# Patient Record
Sex: Female | Born: 1960 | Race: White | Hispanic: No | Marital: Married | Smoking: Never smoker
Health system: Southern US, Community
[De-identification: ages and names within clinical notes are randomized; demographics above are authoritative.]

## PROBLEM LIST (undated history)

## (undated) DIAGNOSIS — N814 Uterovaginal prolapse, unspecified: Secondary | ICD-10-CM

## (undated) DIAGNOSIS — M1990 Unspecified osteoarthritis, unspecified site: Secondary | ICD-10-CM

## (undated) DIAGNOSIS — F419 Anxiety disorder, unspecified: Secondary | ICD-10-CM

## (undated) DIAGNOSIS — Z86010 Personal history of colonic polyps: Secondary | ICD-10-CM

## (undated) DIAGNOSIS — Z973 Presence of spectacles and contact lenses: Secondary | ICD-10-CM

## (undated) HISTORY — PX: NO PAST SURGERIES: SHX2092

## (undated) HISTORY — DX: Anxiety disorder, unspecified: F41.9

## (undated) HISTORY — PX: BLADDER SUSPENSION: SHX72

## (undated) HISTORY — PX: COLONOSCOPY: SHX174

---

## 2002-11-21 ENCOUNTER — Encounter: Payer: Self-pay | Admitting: Obstetrics and Gynecology

## 2002-11-21 ENCOUNTER — Ambulatory Visit (HOSPITAL_COMMUNITY): Admission: RE | Admit: 2002-11-21 | Discharge: 2002-11-21 | Payer: Self-pay | Admitting: Obstetrics and Gynecology

## 2002-12-21 ENCOUNTER — Encounter: Payer: Self-pay | Admitting: Obstetrics and Gynecology

## 2002-12-21 ENCOUNTER — Ambulatory Visit (HOSPITAL_COMMUNITY): Admission: RE | Admit: 2002-12-21 | Discharge: 2002-12-21 | Payer: Self-pay | Admitting: Obstetrics and Gynecology

## 2003-02-11 ENCOUNTER — Ambulatory Visit (HOSPITAL_COMMUNITY): Admission: RE | Admit: 2003-02-11 | Discharge: 2003-02-11 | Payer: Self-pay | Admitting: Obstetrics and Gynecology

## 2003-04-30 ENCOUNTER — Inpatient Hospital Stay (HOSPITAL_COMMUNITY): Admission: AD | Admit: 2003-04-30 | Discharge: 2003-05-02 | Payer: Self-pay | Admitting: Obstetrics and Gynecology

## 2003-06-11 ENCOUNTER — Other Ambulatory Visit: Admission: RE | Admit: 2003-06-11 | Discharge: 2003-06-11 | Payer: Self-pay | Admitting: Obstetrics and Gynecology

## 2004-05-07 ENCOUNTER — Ambulatory Visit (HOSPITAL_COMMUNITY): Admission: RE | Admit: 2004-05-07 | Discharge: 2004-05-07 | Payer: Self-pay | Admitting: Obstetrics and Gynecology

## 2004-08-28 ENCOUNTER — Other Ambulatory Visit: Admission: RE | Admit: 2004-08-28 | Discharge: 2004-08-28 | Payer: Self-pay | Admitting: Obstetrics and Gynecology

## 2005-05-14 ENCOUNTER — Ambulatory Visit (HOSPITAL_COMMUNITY): Admission: RE | Admit: 2005-05-14 | Discharge: 2005-05-14 | Payer: Self-pay | Admitting: Obstetrics and Gynecology

## 2005-12-09 ENCOUNTER — Other Ambulatory Visit: Admission: RE | Admit: 2005-12-09 | Discharge: 2005-12-09 | Payer: Self-pay | Admitting: Obstetrics and Gynecology

## 2006-05-16 ENCOUNTER — Ambulatory Visit (HOSPITAL_COMMUNITY): Admission: RE | Admit: 2006-05-16 | Discharge: 2006-05-16 | Payer: Self-pay | Admitting: Obstetrics and Gynecology

## 2007-05-12 IMAGING — MG MM DIGITAL SCREENING BILAT
5 series · 5 of 5 positions shown · non-contrast
Comparison: none

DG SCREEN MAMMOGRAM BILATERAL
Bilateral CC and MLO view(s) were taken.

DIGITAL SCREENING MAMMOGRAM WITH CAD:
There is a fibroglandular pattern.  No masses or malignant type calcifications are identified.  
Compared with prior studies.

[R CC]
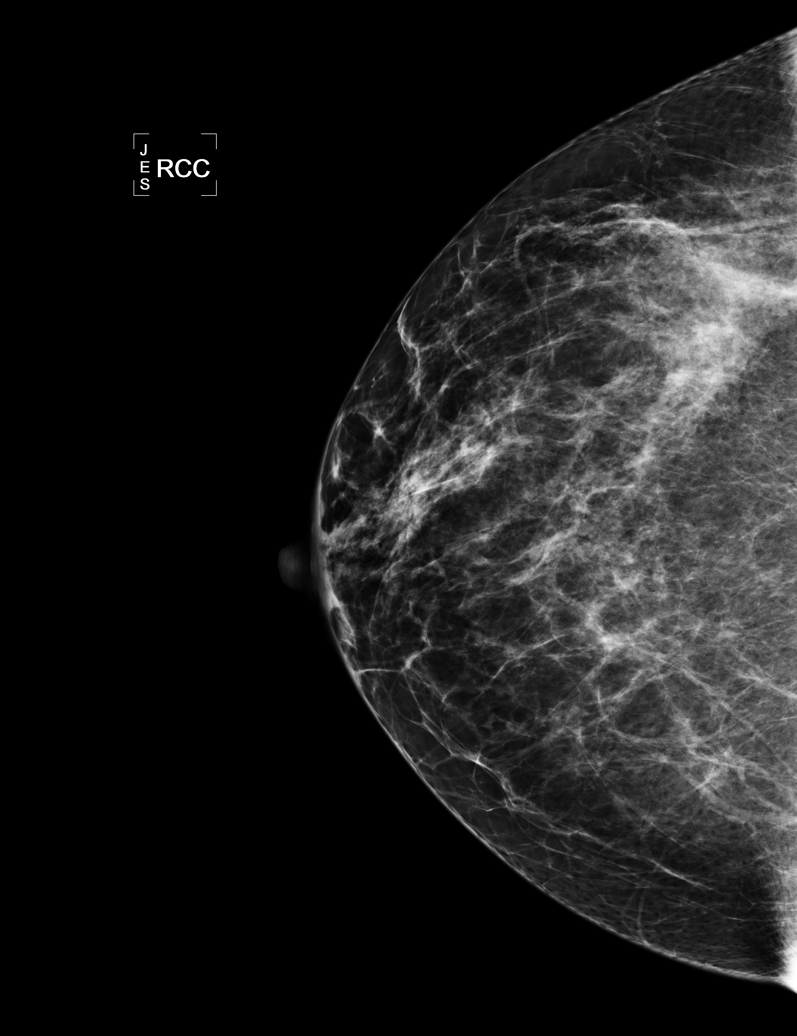

[R MLO (1 of 2)]
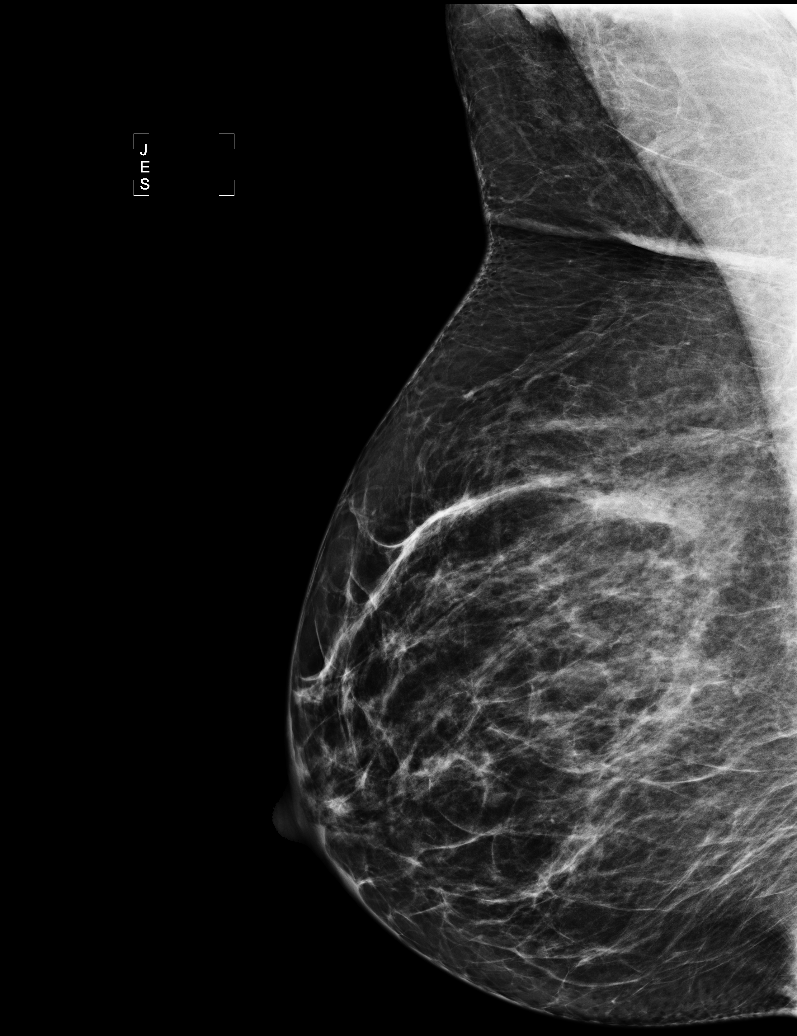

[L CC]
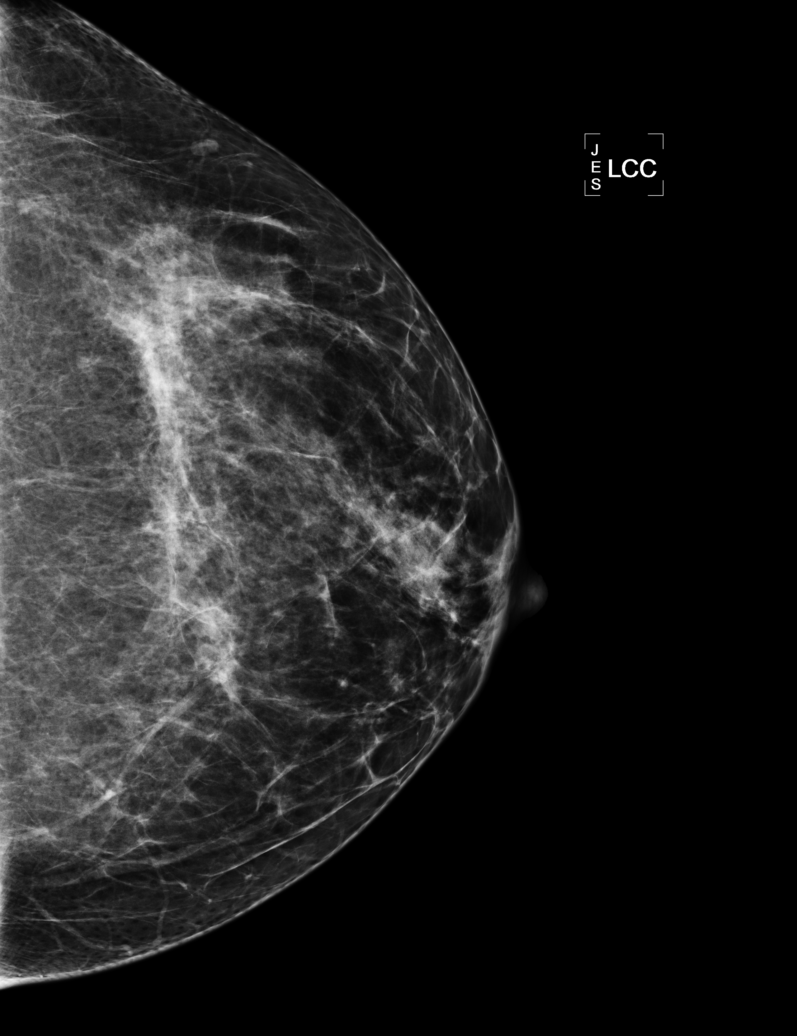

[L MLO]
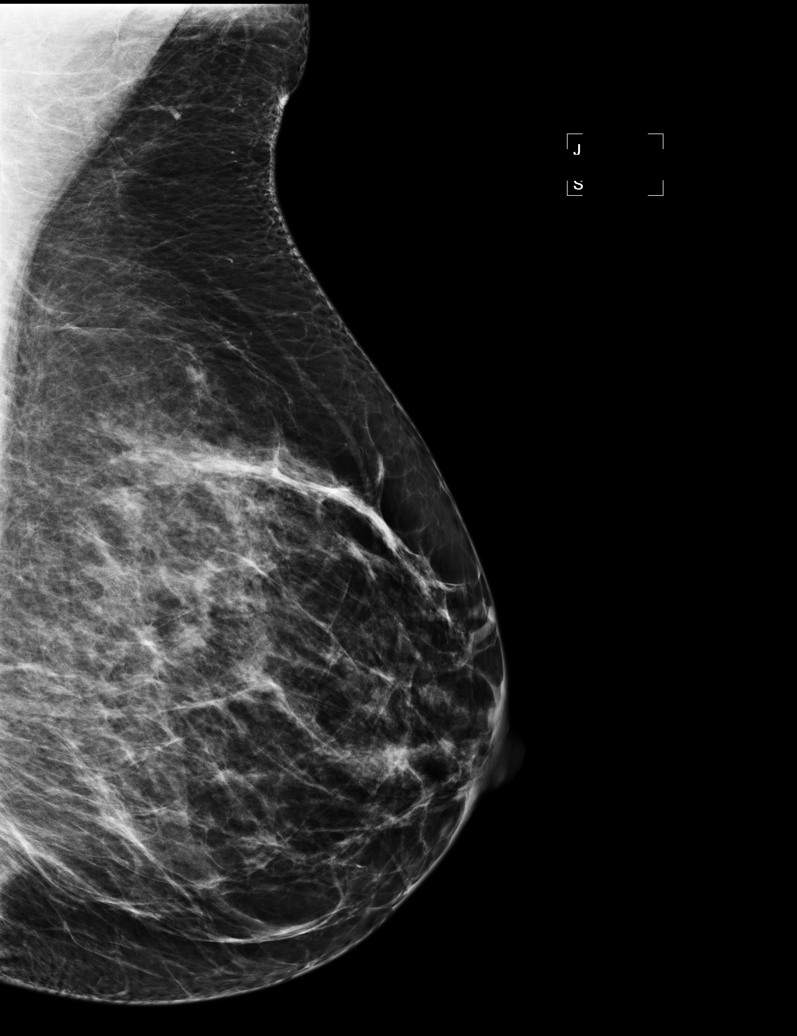

[R MLO (2 of 2)]
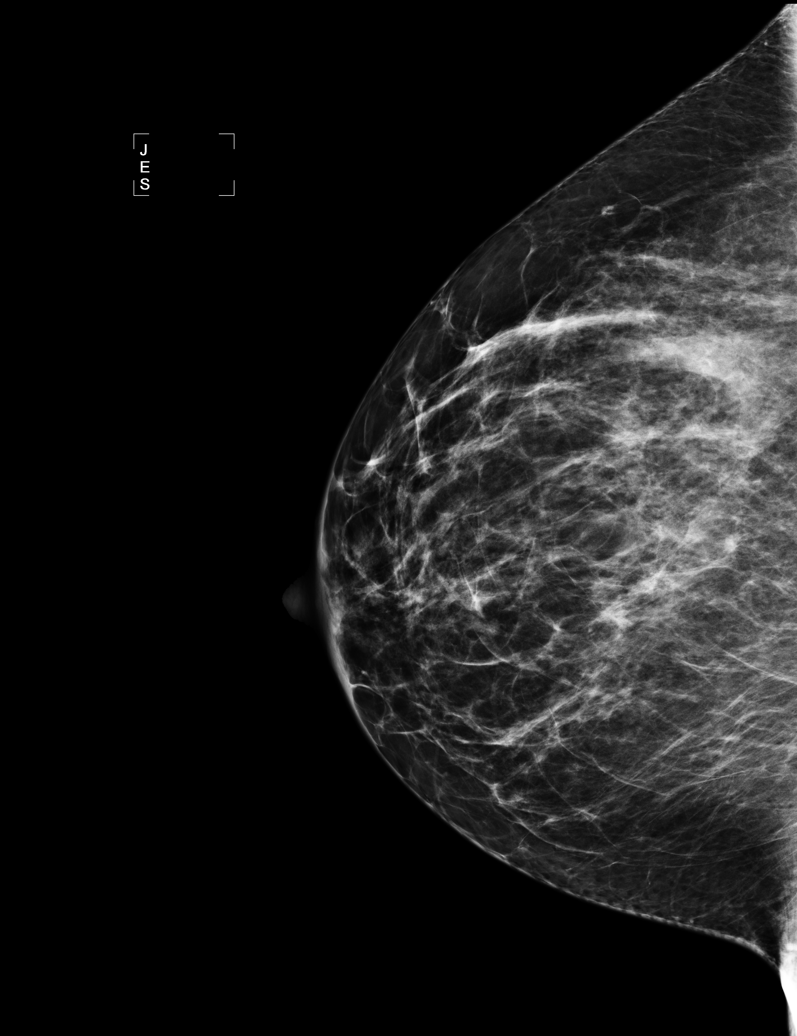

[5 of 5 positions shown; findings below may reference images not displayed]

IMPRESSION: No specific mammographic evidence of malignancy.  Next screening mammogram is recommended in one 
year.

ASSESSMENT: Negative - BI-RADS 1

Screening mammogram in 1 year.
ANALYZED BY COMPUTER AIDED DETECTION. , THIS PROCEDURE WAS A DIGITAL MAMMOGRAM.

## 2012-07-19 DIAGNOSIS — Z86010 Personal history of colonic polyps: Secondary | ICD-10-CM

## 2012-07-19 HISTORY — DX: Personal history of colonic polyps: Z86.010

## 2013-12-26 ENCOUNTER — Telehealth: Payer: Self-pay | Admitting: Genetic Counselor

## 2013-12-26 NOTE — Telephone Encounter (Signed)
LEFT MESSAGE FOR PATIENT TO RETURN CALL TO SCHEDULE GENETIC APPT.  °

## 2014-01-01 ENCOUNTER — Telehealth: Payer: Self-pay | Admitting: Genetic Counselor

## 2014-01-01 NOTE — Telephone Encounter (Signed)
LEFT MESSAGE FOR PATIENT TO RETURN CALL TO SCHEDULE GENETIC APPT.  °

## 2017-08-25 NOTE — Patient Instructions (Addendum)
Alben SpittleMary E Age  55/01/2016      Your procedure is scheduled on 08/29/2017   [facility]  At   0530  A.M.  Call this number if you have problems the morning of surgery:[phone]             OUR ADDRESS IS [address] , [facility] THE              [facility]    Remember:  Do not eat food or drink liquids after midnight.  Take these medicines the morning of surgery with A SIP OF WATER   Prozac  Do not wear jewelry, make-up or nail polish.  Do not wear lotions, powders, or perfumes, or deoderant.  Do not shave 48 hours prior to surgery.  Men may shave face and neck.  Do not bring valuables to the hospital.  [facility] is not responsible for any belongings or valuables.  Contacts, dentures or bridgework may not be worn into surgery.  Leave your suitcase in the car.  After surgery it may be brought to your room.  For patients admitted to the hospital, discharge time will be determined by your treatment team.  Otherwise [facility]   Special instructions:   Please read over the following fact sheets that you were given.    [facility] - Preparing for Surgery Before surgery, you can play an important role.  Because skin is not sterile, your skin needs to be as free of germs as possible.  You can reduce the number of germs on your skin by washing with CHG (chlorahexidine gluconate) soap before surgery.  CHG is an antiseptic cleaner which kills germs and bonds with the skin to continue killing germs even after washing. Please DO NOT use if you have an allergy to CHG or antibacterial soaps.  If your skin becomes reddened/irritated stop using the CHG and inform your nurse when you arrive at Short Stay. Do not shave (including legs and underarms) for at least 48 hours prior to the first CHG shower.  You may shave your face/neck. Please follow these instructions carefully:  1.   Shower with CHG Soap the night before surgery and the  morning of Surgery.  2.  If you choose to wash your hair, wash your hair first as usual with your  normal  shampoo.  3.  After you shampoo, rinse your hair and body thoroughly to remove the  shampoo.                           4.  Use CHG as you would any other liquid soap.  You can apply chg directly  to the skin and wash                       Gently with a scrungie or clean washcloth.  5.  Apply the CHG Soap to your body ONLY FROM THE NECK DOWN.   Do not use on face/ open                           Wound or open sores. Avoid contact with eyes, ears mouth and genitals (private parts).  Wash face,  Genitals (private parts) with your normal soap.             6.  Wash thoroughly, paying special attention to the area where your surgery  will be performed.  7.  Thoroughly rinse your body with warm water from the neck down.  8.  DO NOT shower/wash with your normal soap after using and rinsing off  the CHG Soap.                9.  Pat yourself dry with a clean towel.            10.  Wear clean pajamas.            11.  Place clean sheets on your bed the night of your first shower and do not  sleep with pets. Day of Surgery : Do not apply any lotions/deodorants the morning of surgery.  Please wear clean clothes to the hospital/surgery center.  FAILURE TO FOLLOW THESE INSTRUCTIONS MAY RESULT IN THE CANCELLATION OF YOUR SURGERY PATIENT SIGNATURE_________________________________  NURSE SIGNATURE__________________________________  ________________________________________________________________________  WHAT IS A BLOOD TRANSFUSION? Blood Transfusion Information  A transfusion is the replacement of blood or some of its parts. Blood is made up of multiple cells which provide different functions.  Red blood cells carry oxygen and are used for blood loss replacement.  White blood cells fight against infection.  Platelets control  bleeding.  Plasma helps clot blood.  Other blood products are available for specialized needs, such as hemophilia or other clotting disorders. BEFORE THE TRANSFUSION  Who gives blood for transfusions?   Healthy volunteers who are fully evaluated to make sure their blood is safe. This is blood bank blood. Transfusion therapy is the safest it has ever been in the practice of medicine. Before blood is taken from a donor, a complete history is taken to make sure that person has no history of diseases nor engages in risky social behavior (examples are intravenous drug use or sexual activity with multiple partners). The donor's travel history is screened to minimize risk of transmitting infections, such as malaria. The donated blood is tested for signs of infectious diseases, such as HIV and hepatitis. The blood is then tested to be sure it is compatible with you in order to minimize the chance of a transfusion reaction. If you or a relative donates blood, this is often done in anticipation of surgery and is not appropriate for emergency situations. It takes many days to process the donated blood. RISKS AND COMPLICATIONS Although transfusion therapy is very safe and saves many lives, the main dangers of transfusion include:   Getting an infectious disease.  Developing a transfusion reaction. This is an allergic reaction to something in the blood you were given. Every precaution is taken to prevent this. The decision to have a blood transfusion has been considered carefully by your caregiver before blood is given. Blood is not given unless the benefits outweigh the risks. AFTER THE TRANSFUSION  Right after receiving a blood transfusion, you will usually feel much better and more energetic. This is especially true if your red blood cells have gotten low (anemic). The transfusion raises the level of the red blood cells which carry oxygen, and this usually causes an energy increase.  The nurse  administering the transfusion will monitor you carefully for complications. HOME CARE INSTRUCTIONS  No special instructions are needed after a transfusion. You may find your energy is better. Speak with your caregiver about any  limitations on activity for underlying diseases you may have. SEEK MEDICAL CARE IF:   Your condition is not improving after your transfusion.  You develop redness or irritation at the intravenous (IV) site. SEEK IMMEDIATE MEDICAL CARE IF:  Any of the following symptoms occur over the next 12 hours:  Shaking chills.  You have a temperature by mouth above 102 F (38.9 C), not controlled by medicine.  Chest, back, or muscle pain.  People around you feel you are not acting correctly or are confused.  Shortness of breath or difficulty breathing.  Dizziness and fainting.  You get a rash or develop hives.  You have a decrease in urine output.  Your urine turns a dark color or changes to pink, red, or Smith. Any of the following symptoms occur over the next 10 days:  You have a temperature by mouth above 102 F (38.9 C), not controlled by medicine.  Shortness of breath.  Weakness after normal activity.  The white part of the eye turns yellow (jaundice).  You have a decrease in the amount of urine or are urinating less often.  Your urine turns a dark color or changes to pink, red, or [place]. Document Released: 07/02/2000 Document Revised: 09/27/2011 Document Reviewed: 02/19/2008 [facility] Patient Information 2014 [place], [place].  _______________________________________________________________________

## 2017-08-26 ENCOUNTER — Other Ambulatory Visit: Payer: Self-pay

## 2017-08-26 ENCOUNTER — Encounter (HOSPITAL_COMMUNITY)
Admission: RE | Admit: 2017-08-26 | Discharge: 2017-08-26 | Disposition: A | Discharge status: Home / Self Care | Payer: 59 | Source: Ambulatory Visit | Attending: Obstetrics and Gynecology | Admitting: Obstetrics and Gynecology

## 2017-08-26 ENCOUNTER — Encounter (HOSPITAL_COMMUNITY): Payer: Self-pay | Admitting: *Deleted

## 2017-08-26 DIAGNOSIS — N841 Polyp of cervix uteri: Secondary | ICD-10-CM | POA: Diagnosis not present

## 2017-08-26 DIAGNOSIS — Z79899 Other long term (current) drug therapy: Secondary | ICD-10-CM | POA: Diagnosis not present

## 2017-08-26 DIAGNOSIS — Z86010 Personal history of colonic polyps: Secondary | ICD-10-CM | POA: Diagnosis not present

## 2017-08-26 DIAGNOSIS — N838 Other noninflammatory disorders of ovary, fallopian tube and broad ligament: Secondary | ICD-10-CM | POA: Diagnosis not present

## 2017-08-26 DIAGNOSIS — N814 Uterovaginal prolapse, unspecified: Secondary | ICD-10-CM | POA: Diagnosis not present

## 2017-08-26 DIAGNOSIS — N840 Polyp of corpus uteri: Secondary | ICD-10-CM | POA: Diagnosis not present

## 2017-08-26 DIAGNOSIS — N72 Inflammatory disease of cervix uteri: Secondary | ICD-10-CM | POA: Diagnosis not present

## 2017-08-26 DIAGNOSIS — N8189 Other female genital prolapse: Secondary | ICD-10-CM | POA: Diagnosis present

## 2017-08-26 HISTORY — DX: Personal history of colonic polyps: Z86.010

## 2017-08-26 HISTORY — DX: Presence of spectacles and contact lenses: Z97.3

## 2017-08-26 HISTORY — DX: Unspecified osteoarthritis, unspecified site: M19.90

## 2017-08-26 HISTORY — DX: Uterovaginal prolapse, unspecified: N81.4

## 2017-08-26 LAB — CBC
HCT: 38.3 % (ref 36.0–46.0)
Hemoglobin: 13 g/dL (ref 12.0–15.0)
MCH: 30 pg (ref 26.0–34.0)
MCHC: 33.9 g/dL (ref 30.0–36.0)
MCV: 88.2 fL (ref 78.0–100.0)
Platelets: 212 10*3/uL (ref 150–400)
RBC: 4.34 MIL/uL (ref 3.87–5.11)
RDW: 13.1 % (ref 11.5–15.5)
WBC: 6.1 10*3/uL (ref 4.0–10.5)

## 2017-08-26 LAB — ABO/RH: ABO/RH(D): A NEG

## 2017-08-26 NOTE — Progress Notes (Signed)
Completed PAT appointment face to face.  Npo after mn w/ exception sips of water with prozac.  Arrive at 0530.  CBC and T&S done today.  Reviewed RCC guidelines.  Will do hibiclens shower hs before and am dos.

## 2017-08-28 NOTE — Anesthesia Preprocedure Evaluation (Addendum)
Anesthesia Evaluation  Patient identified by MRN, date of birth, ID band Patient awake    Reviewed: Allergy & Precautions, NPO status , Patient's Chart, lab work & pertinent test results  History of Anesthesia Complications Negative for: history of anesthetic complications  Airway Mallampati: I  TM Distance: >3 FB Neck ROM: Full    Dental  (+) Dental Advisory Given   Pulmonary neg pulmonary ROS,    Pulmonary exam normal        Cardiovascular negative cardio ROS   Rhythm:Regular Rate:Normal     Neuro/Psych negative neurological ROS     GI/Hepatic negative GI ROS, Neg liver ROS,   Endo/Other  negative endocrine ROS  Renal/GU negative Renal ROS     Musculoskeletal   Abdominal   Peds  Hematology negative hematology ROS (+)   Anesthesia Other Findings   Reproductive/Obstetrics                             Anesthesia Physical Anesthesia Plan  ASA: I  Anesthesia Plan: General   Post-op Pain Management:    Induction: Intravenous  PONV Risk Score and Plan: 4 or greater and Scopolamine patch - Pre-op, Dexamethasone and Ondansetron  Airway Management Planned: Oral ETT  Additional Equipment:   Intra-op Plan:   Post-operative Plan: Extubation in OR  Informed Consent: I have reviewed the patients History and Physical, chart, labs and discussed the procedure including the risks, benefits and alternatives for the proposed anesthesia with the patient or authorized representative who has indicated his/her understanding and acceptance.   Dental advisory given  Plan Discussed with: CRNA and Surgeon  Anesthesia Plan Comments: (Plan routine monitors, GETA)        Anesthesia Quick Evaluation

## 2017-08-29 ENCOUNTER — Encounter (HOSPITAL_BASED_OUTPATIENT_CLINIC_OR_DEPARTMENT_OTHER)
Admission: RE | Disposition: A | Discharge status: Home / Self Care | Payer: Self-pay | Source: Ambulatory Visit | Attending: Obstetrics and Gynecology

## 2017-08-29 ENCOUNTER — Ambulatory Visit (HOSPITAL_BASED_OUTPATIENT_CLINIC_OR_DEPARTMENT_OTHER): Payer: 59 | Admitting: Anesthesiology

## 2017-08-29 ENCOUNTER — Observation Stay (HOSPITAL_BASED_OUTPATIENT_CLINIC_OR_DEPARTMENT_OTHER)
Admission: RE | Admit: 2017-08-29 | Discharge: 2017-08-30 | Disposition: A | Discharge status: Home / Self Care | Payer: 59 | Source: Ambulatory Visit | Attending: Obstetrics and Gynecology | Admitting: Obstetrics and Gynecology

## 2017-08-29 ENCOUNTER — Other Ambulatory Visit: Payer: Self-pay

## 2017-08-29 ENCOUNTER — Encounter (HOSPITAL_BASED_OUTPATIENT_CLINIC_OR_DEPARTMENT_OTHER): Payer: Self-pay | Admitting: *Deleted

## 2017-08-29 DIAGNOSIS — N840 Polyp of corpus uteri: Secondary | ICD-10-CM | POA: Insufficient documentation

## 2017-08-29 DIAGNOSIS — N814 Uterovaginal prolapse, unspecified: Secondary | ICD-10-CM | POA: Insufficient documentation

## 2017-08-29 DIAGNOSIS — Z86010 Personal history of colonic polyps: Secondary | ICD-10-CM | POA: Insufficient documentation

## 2017-08-29 DIAGNOSIS — Z79899 Other long term (current) drug therapy: Secondary | ICD-10-CM | POA: Insufficient documentation

## 2017-08-29 DIAGNOSIS — N8189 Other female genital prolapse: Secondary | ICD-10-CM | POA: Diagnosis not present

## 2017-08-29 DIAGNOSIS — N838 Other noninflammatory disorders of ovary, fallopian tube and broad ligament: Secondary | ICD-10-CM | POA: Insufficient documentation

## 2017-08-29 DIAGNOSIS — N72 Inflammatory disease of cervix uteri: Secondary | ICD-10-CM | POA: Insufficient documentation

## 2017-08-29 DIAGNOSIS — N819 Female genital prolapse, unspecified: Secondary | ICD-10-CM | POA: Diagnosis present

## 2017-08-29 DIAGNOSIS — N841 Polyp of cervix uteri: Secondary | ICD-10-CM | POA: Insufficient documentation

## 2017-08-29 HISTORY — PX: LAPAROSCOPIC VAGINAL HYSTERECTOMY WITH SALPINGECTOMY: SHX6680

## 2017-08-29 LAB — TYPE AND SCREEN
ABO/RH(D): A NEG
Antibody Screen: NEGATIVE

## 2017-08-29 SURGERY — HYSTERECTOMY, VAGINAL, LAPAROSCOPY-ASSISTED, WITH SALPINGECTOMY
Anesthesia: General | Site: Vagina | Laterality: Bilateral

## 2017-08-29 MED ORDER — HYDROMORPHONE HCL 1 MG/ML IJ SOLN
0.2500 mg | INTRAMUSCULAR | Status: DC | PRN
  Administered 2017-08-29 (×3): 0.5 mg via INTRAVENOUS
  Filled 2017-08-29: qty 0.5

## 2017-08-29 MED ORDER — HYDROMORPHONE HCL 1 MG/ML IJ SOLN
1.0000 mg | INTRAMUSCULAR | Status: DC | PRN
  Administered 2017-08-29: 1 mg via INTRAVENOUS
  Filled 2017-08-29: qty 1

## 2017-08-29 MED ORDER — SUGAMMADEX SODIUM 200 MG/2ML IV SOLN
INTRAVENOUS | Status: AC
  Filled 2017-08-29: qty 2

## 2017-08-29 MED ORDER — SCOPOLAMINE 1 MG/3DAYS TD PT72
MEDICATED_PATCH | TRANSDERMAL | Status: DC | PRN
  Administered 2017-08-29: 1 via TRANSDERMAL

## 2017-08-29 MED ORDER — DEXAMETHASONE SODIUM PHOSPHATE 10 MG/ML IJ SOLN
INTRAMUSCULAR | Status: AC
  Filled 2017-08-29: qty 1

## 2017-08-29 MED ORDER — KETOROLAC TROMETHAMINE 30 MG/ML IJ SOLN
INTRAMUSCULAR | Status: AC
  Filled 2017-08-29: qty 1

## 2017-08-29 MED ORDER — LIDOCAINE-EPINEPHRINE (PF) 1 %-1:200000 IJ SOLN
INTRAMUSCULAR | Status: DC | PRN
Start: 2017-08-29 — End: 2017-08-29
  Administered 2017-08-29: 3 mL

## 2017-08-29 MED ORDER — LACTATED RINGERS IV SOLN
INTRAVENOUS | Status: DC
  Administered 2017-08-29: 125 mL/h via INTRAVENOUS
  Filled 2017-08-29: qty 1000

## 2017-08-29 MED ORDER — LIDOCAINE 2% (20 MG/ML) 5 ML SYRINGE
INTRAMUSCULAR | Status: DC | PRN
  Administered 2017-08-29: 60 mg via INTRAVENOUS

## 2017-08-29 MED ORDER — PROPOFOL 10 MG/ML IV BOLUS
INTRAVENOUS | Status: AC
  Filled 2017-08-29: qty 40

## 2017-08-29 MED ORDER — ONDANSETRON HCL 4 MG PO TABS
4.0000 mg | ORAL_TABLET | Freq: Four times a day (QID) | ORAL | Status: DC | PRN
  Filled 2017-08-29: qty 1

## 2017-08-29 MED ORDER — MIDAZOLAM HCL 2 MG/2ML IJ SOLN
INTRAMUSCULAR | Status: AC
  Filled 2017-08-29: qty 2

## 2017-08-29 MED ORDER — HYDROMORPHONE HCL 1 MG/ML IJ SOLN
INTRAMUSCULAR | Status: AC
  Filled 2017-08-29: qty 1

## 2017-08-29 MED ORDER — KETOROLAC TROMETHAMINE 30 MG/ML IJ SOLN
30.0000 mg | Freq: Three times a day (TID) | INTRAMUSCULAR | Status: DC
  Administered 2017-08-29 – 2017-08-30 (×2): 30 mg via INTRAVENOUS
  Filled 2017-08-29: qty 1

## 2017-08-29 MED ORDER — MENTHOL 3 MG MT LOZG
1.0000 | LOZENGE | OROMUCOSAL | Status: DC | PRN
  Filled 2017-08-29: qty 9

## 2017-08-29 MED ORDER — ROCURONIUM BROMIDE 10 MG/ML (PF) SYRINGE
PREFILLED_SYRINGE | INTRAVENOUS | Status: DC | PRN
  Administered 2017-08-29: 10 mg via INTRAVENOUS
  Administered 2017-08-29: 50 mg via INTRAVENOUS

## 2017-08-29 MED ORDER — DEXAMETHASONE SODIUM PHOSPHATE 10 MG/ML IJ SOLN
INTRAMUSCULAR | Status: DC | PRN
  Administered 2017-08-29: 10 mg via INTRAVENOUS

## 2017-08-29 MED ORDER — MEPERIDINE HCL 25 MG/ML IJ SOLN
6.2500 mg | INTRAMUSCULAR | Status: DC | PRN
  Filled 2017-08-29: qty 1

## 2017-08-29 MED ORDER — FLUOXETINE HCL 20 MG PO TABS
20.0000 mg | ORAL_TABLET | Freq: Every day | ORAL | Status: DC
  Filled 2017-08-29: qty 1

## 2017-08-29 MED ORDER — FENTANYL CITRATE (PF) 100 MCG/2ML IJ SOLN
INTRAMUSCULAR | Status: DC | PRN
  Administered 2017-08-29 (×4): 50 ug via INTRAVENOUS

## 2017-08-29 MED ORDER — ONDANSETRON HCL 4 MG/2ML IJ SOLN
INTRAMUSCULAR | Status: AC
  Filled 2017-08-29: qty 2

## 2017-08-29 MED ORDER — SUCCINYLCHOLINE CHLORIDE 200 MG/10ML IV SOSY
PREFILLED_SYRINGE | INTRAVENOUS | Status: AC
  Filled 2017-08-29: qty 10

## 2017-08-29 MED ORDER — MIDAZOLAM HCL 2 MG/2ML IJ SOLN
INTRAMUSCULAR | Status: DC | PRN
  Administered 2017-08-29: 2 mg via INTRAVENOUS

## 2017-08-29 MED ORDER — KETOROLAC TROMETHAMINE 30 MG/ML IJ SOLN
INTRAMUSCULAR | Status: DC | PRN
  Administered 2017-08-29: 30 mg via INTRAVENOUS

## 2017-08-29 MED ORDER — DEXTROSE 5 % IV SOLN
2.0000 g | INTRAVENOUS | Status: AC
  Administered 2017-08-29: 2 g via INTRAVENOUS
  Filled 2017-08-29: qty 2

## 2017-08-29 MED ORDER — CEFOTETAN DISODIUM-DEXTROSE 2-2.08 GM-%(50ML) IV SOLR
INTRAVENOUS | Status: AC
  Filled 2017-08-29: qty 50

## 2017-08-29 MED ORDER — DEXTROSE IN LACTATED RINGERS 5 % IV SOLN
INTRAVENOUS | Status: DC
  Administered 2017-08-29: 16:00:00 via INTRAVENOUS
  Filled 2017-08-29 (×2): qty 1000

## 2017-08-29 MED ORDER — ONDANSETRON HCL 4 MG/2ML IJ SOLN
INTRAMUSCULAR | Status: DC | PRN
  Administered 2017-08-29: 4 mg via INTRAVENOUS

## 2017-08-29 MED ORDER — BUPIVACAINE HCL (PF) 0.25 % IJ SOLN
INTRAMUSCULAR | Status: DC | PRN
  Administered 2017-08-29: 3 mL

## 2017-08-29 MED ORDER — FENTANYL CITRATE (PF) 250 MCG/5ML IJ SOLN
INTRAMUSCULAR | Status: AC
  Filled 2017-08-29: qty 5

## 2017-08-29 MED ORDER — LIDOCAINE 2% (20 MG/ML) 5 ML SYRINGE
INTRAMUSCULAR | Status: AC
  Filled 2017-08-29: qty 5

## 2017-08-29 MED ORDER — ROCURONIUM BROMIDE 10 MG/ML (PF) SYRINGE
PREFILLED_SYRINGE | INTRAVENOUS | Status: AC
  Filled 2017-08-29: qty 5

## 2017-08-29 MED ORDER — SUGAMMADEX SODIUM 200 MG/2ML IV SOLN
INTRAVENOUS | Status: DC | PRN
  Administered 2017-08-29: 350 mg via INTRAVENOUS

## 2017-08-29 MED ORDER — ONDANSETRON HCL 4 MG/2ML IJ SOLN
4.0000 mg | Freq: Four times a day (QID) | INTRAMUSCULAR | Status: DC | PRN
  Filled 2017-08-29: qty 2

## 2017-08-29 MED ORDER — PROPOFOL 10 MG/ML IV BOLUS
INTRAVENOUS | Status: DC | PRN
  Administered 2017-08-29: 150 mg via INTRAVENOUS

## 2017-08-29 MED ORDER — PROMETHAZINE HCL 25 MG/ML IJ SOLN
6.2500 mg | INTRAMUSCULAR | Status: DC | PRN
  Filled 2017-08-29: qty 1

## 2017-08-29 MED ORDER — LACTATED RINGERS IV SOLN
INTRAVENOUS | Status: DC
  Administered 2017-08-29 (×3): via INTRAVENOUS
  Filled 2017-08-29: qty 1000

## 2017-08-29 MED ORDER — ARTIFICIAL TEARS OPHTHALMIC OINT
TOPICAL_OINTMENT | OPHTHALMIC | Status: AC
  Filled 2017-08-29: qty 3.5

## 2017-08-29 MED ORDER — MIDAZOLAM HCL 2 MG/2ML IJ SOLN
0.5000 mg | Freq: Once | INTRAMUSCULAR | Status: DC | PRN
  Filled 2017-08-29: qty 2

## 2017-08-29 SURGICAL SUPPLY — 69 items
ADH SKN CLS APL DERMABOND .7 (GAUZE/BANDAGES/DRESSINGS) ×1
BAG RETRIEVAL 10 (BASKET)
BAG RETRIEVAL 10MM (BASKET)
BARRIER ADHS 3X4 INTERCEED (GAUZE/BANDAGES/DRESSINGS) IMPLANT
BLADE CLIPPER SURG (BLADE) IMPLANT
BRR ADH 4X3 ABS CNTRL BYND (GAUZE/BANDAGES/DRESSINGS)
CANISTER SUCT 3000ML PPV (MISCELLANEOUS) ×2 IMPLANT
CLOSURE WOUND 1/4X4 (GAUZE/BANDAGES/DRESSINGS)
CLOTH BEACON ORANGE TIMEOUT ST (SAFETY) ×3 IMPLANT
COVER BACK TABLE 60X90IN (DRAPES) ×6 IMPLANT
DECANTER SPIKE VIAL GLASS SM (MISCELLANEOUS) ×3 IMPLANT
DERMABOND ADVANCED (GAUZE/BANDAGES/DRESSINGS) ×2
DERMABOND ADVANCED .7 DNX12 (GAUZE/BANDAGES/DRESSINGS) ×1 IMPLANT
DRSG OPSITE POSTOP 3X4 (GAUZE/BANDAGES/DRESSINGS) ×3 IMPLANT
DRSG TEGADERM 2-3/8X2-3/4 SM (GAUZE/BANDAGES/DRESSINGS) IMPLANT
DRSG TEGADERM 4X4.75 (GAUZE/BANDAGES/DRESSINGS) ×2 IMPLANT
DURAPREP 26ML APPLICATOR (WOUND CARE) ×3 IMPLANT
ELECT LIGASURE LONG (ELECTRODE) IMPLANT
ELECT LIGASURE SHORT 9 REUSE (ELECTRODE) IMPLANT
ELECT REM PT RETURN 9FT ADLT (ELECTROSURGICAL) ×3
ELECTRODE REM PT RTRN 9FT ADLT (ELECTROSURGICAL) ×1 IMPLANT
FILTER SMOKE EVAC LAPAROSHD (FILTER) IMPLANT
GAUZE SPONGE 4X4 12PLY STRL (GAUZE/BANDAGES/DRESSINGS) ×2 IMPLANT
GLOVE BIO SURGEON STRL SZ 6.5 (GLOVE) ×6 IMPLANT
GLOVE BIO SURGEON STRL SZ7 (GLOVE) ×4 IMPLANT
GLOVE BIO SURGEON STRL SZ7.5 (GLOVE) ×4 IMPLANT
GLOVE BIO SURGEONS STRL SZ 6.5 (GLOVE) ×3
GLOVE BIOGEL PI IND STRL 7.0 (GLOVE) ×1 IMPLANT
GLOVE BIOGEL PI INDICATOR 7.0 (GLOVE) ×2
GLOVE ECLIPSE 6.5 STRL STRAW (GLOVE) ×3 IMPLANT
GOWN STRL REUS W/TWL LRG LVL3 (GOWN DISPOSABLE) ×2 IMPLANT
GOWN STRL REUS W/TWL XL LVL3 (GOWN DISPOSABLE) ×2 IMPLANT
HOLDER FOLEY CATH W/STRAP (MISCELLANEOUS) ×3 IMPLANT
KIT RM TURNOVER CYSTO AR (KITS) ×3 IMPLANT
LEGGING LITHOTOMY PAIR STRL (DRAPES) ×3 IMPLANT
NS IRRIG 500ML POUR BTL (IV SOLUTION) ×3 IMPLANT
PACK LAVH (CUSTOM PROCEDURE TRAY) ×3 IMPLANT
PACK ROBOTIC GOWN (GOWN DISPOSABLE) ×3 IMPLANT
PACK TRENDGUARD 450 HYBRID PRO (MISCELLANEOUS) IMPLANT
PACK TRENDGUARD 600 HYBRD PROC (MISCELLANEOUS) IMPLANT
PAD OB MATERNITY 4.3X12.25 (PERSONAL CARE ITEMS) ×3 IMPLANT
PAD PREP 24X48 CUFFED NSTRL (MISCELLANEOUS) ×3 IMPLANT
SCISSORS LAP 5X35 DISP (ENDOMECHANICALS) IMPLANT
SEALER TISSUE G2 CVD JAW 45CM (ENDOMECHANICALS) ×3 IMPLANT
SET IRRIG TUBING LAPAROSCOPIC (IRRIGATION / IRRIGATOR) ×3 IMPLANT
STRIP CLOSURE SKIN 1/4X4 (GAUZE/BANDAGES/DRESSINGS) IMPLANT
SUT MNCRL 0 MO-4 VIOLET 18 CR (SUTURE) ×1 IMPLANT
SUT MNCRL 0 VIOLET 6X18 (SUTURE) ×1 IMPLANT
SUT MON AB 2-0 CT1 36 (SUTURE) ×3 IMPLANT
SUT MONOCRYL 0 6X18 (SUTURE) ×2
SUT MONOCRYL 0 MO 4 18  CR/8 (SUTURE) ×6
SUT VIC AB 3-0 PS2 18 (SUTURE) ×3
SUT VIC AB 3-0 PS2 18XBRD (SUTURE) ×1 IMPLANT
SUT VIC AB 3-0 SH 27 (SUTURE)
SUT VIC AB 3-0 SH 27X BRD (SUTURE) IMPLANT
SUT VICRYL 0 UR6 27IN ABS (SUTURE) ×3 IMPLANT
SYR 3ML 23GX1 SAFETY (SYRINGE) IMPLANT
SYR BULB IRRIGATION 50ML (SYRINGE) ×3 IMPLANT
SYS BAG RETRIEVAL 10MM (BASKET)
SYSTEM BAG RETRIEVAL 10MM (BASKET) IMPLANT
TOWEL OR 17X24 6PK STRL BLUE (TOWEL DISPOSABLE) ×6 IMPLANT
TRAY FOLEY CATH SILVER 14FR (SET/KITS/TRAYS/PACK) ×3 IMPLANT
TRENDGUARD 450 HYBRID PRO PACK (MISCELLANEOUS) ×3
TRENDGUARD 600 HYBRID PROC PK (MISCELLANEOUS)
TROCAR OPTI TIP 5M 100M (ENDOMECHANICALS) ×3 IMPLANT
TROCAR XCEL NON-BLD 11X100MML (ENDOMECHANICALS) ×3 IMPLANT
TUBING INSUF HEATED (TUBING) ×3 IMPLANT
WARMER LAPAROSCOPE (MISCELLANEOUS) ×3 IMPLANT
WATER STERILE IRR 500ML POUR (IV SOLUTION) ×3 IMPLANT

## 2017-08-29 NOTE — H&P (Signed)
Renee Conley is an 57 y.o. female with uterine prolapse presents for surgical mngt.  No PMB    Menstrual History: No LMP recorded. Patient is postmenopausal.    Past Medical History:  Diagnosis Date  . Arthritis   . History of colon polyps 2014  . Uterine prolapse   . Wears contact lenses     Past Surgical History:  Procedure Laterality Date  . COLONOSCOPY  2014  approx.  . NO PAST SURGERIES      History reviewed. No pertinent family history.  Social History:  reports that  has never smoked. she has never used smokeless tobacco. She reports that she drinks alcohol. She reports that she does not use drugs.  Allergies: No Known Allergies  Medications Prior to Admission  Medication Sig Dispense Refill Last Dose  . Calcium-Phosphorus-Vitamin D (CALCIUM/VITAMIN D3/ADULT GUMMY PO) Take 2 tablets by mouth daily with lunch. Typically once to twice a week   Past Week at Unknown time  . FLUoxetine (PROZAC) 20 MG tablet Take 20 mg by mouth daily.  0 08/29/2017 at 0415  . ibuprofen (ADVIL,MOTRIN) 200 MG tablet Take 400-600 mg by mouth every 8 (eight) hours as needed (for pain.).   08/27/2017 at Unknown time    ROS  Blood pressure 110/67, pulse 63, temperature 97.7 F (36.5 C), temperature source Oral, resp. rate 18, height 5\' 8"  (1.727 m), weight 187 lb 4.8 oz (85 kg), SpO2 100 %. Physical Exam  Gen - NAD CV - RRR Lungs - clear Abd - soft, NT/ND Ext - NT  Hgb 13  Assessment/Plan: Uterine prolapse LAVH/BSO R/b/a discussed, questions answered, informed consent  Renee Conley 08/29/2017, 7:09 AM

## 2017-08-29 NOTE — Anesthesia Postprocedure Evaluation (Signed)
Anesthesia Post Note  Patient: Renee Conley  Procedure(s) Performed: LAPAROSCOPIC ASSISTED VAGINAL HYSTERECTOMY WITH SALPINGECTOMY, ANTERIOR REPAIR (Bilateral Vagina )     Patient location during evaluation: PACU Anesthesia Type: General Level of consciousness: awake and alert, patient cooperative and oriented Pain management: pain level controlled Vital Signs Assessment: post-procedure vital signs reviewed and stable Respiratory status: spontaneous breathing, nonlabored ventilation and respiratory function stable Cardiovascular status: blood pressure returned to baseline and stable Postop Assessment: no apparent nausea or vomiting Anesthetic complications: no    Last Vitals:  Vitals:   08/29/17 1030 08/29/17 1045  BP: 120/69   Pulse: 75 75  Resp: 14 13  Temp:    SpO2: 100% 97%    Last Pain:  Vitals:   08/29/17 1045  TempSrc:   PainSc: 4                  Doraine Schexnider,E. Cartrell Bentsen

## 2017-08-29 NOTE — Op Note (Signed)
08/29/2017  9:27 AM  PATIENT:  Renee Conley  57 y.o. female  PRE-OPERATIVE DIAGNOSIS:  PROLAPSE  POST-OPERATIVE DIAGNOSIS:  PROLAPSE  PROCEDURE:  Procedure(s) with comments: LAPAROSCOPIC ASSISTED VAGINAL HYSTERECTOMY WITH SALPINGECTOMY, ANTERIOR REPAIR (Bilateral) - NEED BED  SURGEON:  Surgeon(s) and Role:    * Zelphia CairoAdkins, Gevork Ayyad, MD - Primary    * [name], [name], MD - Assisting  PHYSICIAN ASSISTANT: Suzanne BoronM Grewal, MD  ANESTHESIA:   general  EBL:  150 mL   BLOOD ADMINISTERED:none  DRAINS: foley   LOCAL MEDICATIONS USED:  MARCAINE    and LIDOCAINE   SPECIMEN:  Source of Specimen:  uterus, bilateral tubes and ovaries  DISPOSITION OF SPECIMEN:  PATHOLOGY  COUNTS:  YES  TOURNIQUET:  * No tourniquets in log *  DICTATION: .Other Dictation: Dictation Number pending  PLAN OF CARE: Admit for overnight observation  PATIENT DISPOSITION:  PACU - hemodynamically stable.   Delay start of Pharmacological VTE agent (>24hrs) due to surgical blood loss or risk of bleeding: no

## 2017-08-29 NOTE — Anesthesia Procedure Notes (Signed)
Procedure Name: Intubation Date/Time: 08/29/2017 7:33 AM Performed by: Annye Asa, MD Pre-anesthesia Checklist: Patient identified, Emergency Drugs available, Suction available and Patient being monitored Patient Re-evaluated:Patient Re-evaluated prior to induction Oxygen Delivery Method: Circle system utilized Preoxygenation: Pre-oxygenation with 100% oxygen Induction Type: IV induction Ventilation: Mask ventilation without difficulty Laryngoscope Size: Mac and 3 Grade View: Grade I Tube type: Oral Tube size: 7.0 mm Number of attempts: 1 Airway Equipment and Method: Stylet and Oral airway Placement Confirmation: ETT inserted through vocal cords under direct vision,  positive ETCO2 and breath sounds checked- equal and bilateral Secured at: 21 cm Tube secured with: Tape Dental Injury: Teeth and Oropharynx as per pre-operative assessment

## 2017-08-29 NOTE — Transfer of Care (Signed)
Last Vitals:  Vitals:   08/29/17 0550  BP: 110/67  Pulse: 63  Resp: 18  Temp: 36.5 C  SpO2: 100%    Last Pain:  Vitals:   08/29/17 0550  TempSrc: Oral      Patients Stated Pain Goal: 7 (08/29/17 [id number])  Immediate Anesthesia Transfer of Care Note  Patient: Kelin E Ropp  Procedure(s) Performed: Procedure(s) (LRB): LAPAROSCOPIC ASSISTED VAGINAL HYSTERECTOMY WITH SALPINGECTOMY, ANTERIOR REPAIR (Bilateral)  Patient Location: PACU  Anesthesia Type: General  Level of Consciousness: awake, alert  and oriented  Airway & Oxygen Therapy: Patient Spontanous Breathing and Patient connected to nasal cannula oxygen  Post-op Assessment: Report given to PACU RN and Post -op Vital signs reviewed and stable  Post vital signs: Reviewed and stable  Complications: No apparent anesthesia complications

## 2017-08-29 NOTE — Progress Notes (Signed)
Day of Surgery Procedure(s) (LRB): LAPAROSCOPIC ASSISTED VAGINAL HYSTERECTOMY WITH SALPINGECTOMY, ANTERIOR REPAIR (Bilateral)  Subjective: Patient reports pain well controlled.  No n/v.  No CP/SOB.      Objective: I have reviewed patient's vital signs and intake and output.  General: alert and cooperative GI: normal findings: soft, non-tender Extremities: extremities normal, atraumatic, no cyanosis or edema Vaginal Bleeding: none  Assessment: s/p Procedure(s) with comments: LAPAROSCOPIC ASSISTED VAGINAL HYSTERECTOMY WITH SALPINGECTOMY, ANTERIOR REPAIR (Bilateral) - NEED BED: stable and progressing well  Plan: Advance diet Encourage ambulation Advance to PO medication Discontinue IV fluids  LOS: 0 days    Zelphia CairoGretchen Aunesti Pellegrino 08/29/2017, 5:18 PM

## 2017-08-30 ENCOUNTER — Encounter (HOSPITAL_BASED_OUTPATIENT_CLINIC_OR_DEPARTMENT_OTHER): Payer: Self-pay | Admitting: Obstetrics and Gynecology

## 2017-08-30 DIAGNOSIS — N8189 Other female genital prolapse: Secondary | ICD-10-CM | POA: Diagnosis not present

## 2017-08-30 LAB — CBC
HCT: 32.3 % — ABNORMAL LOW (ref 36.0–46.0)
Hemoglobin: 11.4 g/dL — ABNORMAL LOW (ref 12.0–15.0)
MCH: 31.6 pg (ref 26.0–34.0)
MCHC: 35.3 g/dL (ref 30.0–36.0)
MCV: 89.5 fL (ref 78.0–100.0)
Platelets: 176 10*3/uL (ref 150–400)
RBC: 3.61 MIL/uL — AB (ref 3.87–5.11)
RDW: 13.4 % (ref 11.5–15.5)
WBC: 9.5 10*3/uL (ref 4.0–10.5)

## 2017-08-30 MED ORDER — SODIUM CHLORIDE 0.9 % IJ SOLN
INTRAMUSCULAR | Status: AC
  Filled 2017-08-30: qty 10

## 2017-08-30 MED ORDER — OXYCODONE-ACETAMINOPHEN 2.5-325 MG PO TABS: 2.0000 | ORAL_TABLET | ORAL | 0 refills | Status: DC | PRN

## 2017-08-30 MED ORDER — IBUPROFEN 800 MG PO TABS: 800.0000 mg | ORAL_TABLET | Freq: Three times a day (TID) | ORAL | 1 refills | Status: AC | PRN

## 2017-08-30 NOTE — Op Note (Signed)
NAMAndres Ege:  Renee Conley, Renee Conley           ACCOUNT NO.:  [id number]  MEDICAL RECORD NO.:  [id number]  LOCATION:                                 FACILITY:  PHYSICIAN:  Zelphia CairoGretchen Arrietty Dercole, MD         DATE OF BIRTH:  DATE OF PROCEDURE:  08/29/2017 DATE OF DISCHARGE:                              OPERATIVE REPORT   PREOPERATIVE DIAGNOSIS:  Uterine prolapse.  POSTOPERATIVE DIAGNOSIS:  Uterine prolapse and cystocele.  PROCEDURE: 1. Laparoscopic-assisted vaginal hysterectomy. 2. Bilateral salpingo-oophorectomy. 3. Anterior repair.  SURGEON:  Zelphia CairoGretchen Aliha Diedrich, MD.  ASSISTANStann Mainland:  [name] L. Vincente PoliGrewal, M.D.  ESTIMATED BLOOD LOSS:  150 mL.  URINE OUTPUT:  Clear.  COMPLICATIONS:  None.  SPECIMEN:  Uterus with bilateral fallopian tubes and ovaries.  ANESTHESIA:  General.  COMPLICATIONS:  None.  DESCRIPTION OF PROCEDURE:  The patient was taken to the operating room after informed consent was obtained.  She was placed in the dorsal lithotomy position using Allen stirrups.  She was prepped and draped in sterile fashion and a Foley catheter was inserted sterilely.  Bivalve speculum was placed in the vagina and a Hulka clamp was placed on the cervix.  The speculum was removed and our attention was turned to the abdomen.  0.25% Marcaine was used to provide local anesthesia to the site of our infraumbilical and suprapubic incision, and infraumbilical incision was made with a scalpel and extended to the level of fascia bluntly using a Kelly clamp.  Optical trocar was inserted under direct visualization.  Once intraperitoneal placement was confirmed, CO2 was turned on and a survey of the pelvis was performed.  The uterus, bilateral ovaries and fallopian tubes appeared normal without lesions or abnormalities.  A suprapubic incision was made 2 cm above the pubic bone and a 5 mm trocar was inserted under direct visualization.  The right adnexa was grasped with atraumatic graspers and tented upwards  and toward the midline.  The EnSeal device was used to clamp, cauterize and cut the infundibulopelvic ligament.  This was extended down the broad ligament to the round ligament.  The round ligament was grasped, cauterized, and cut using the EnSeal.  Hemostasis was noted and this procedure was repeated on the opposite adnexa.  At this point, all instruments were removed from the abdomen and our attention was turned to the vagina.  Hulka clamp was removed and a circumferential incision was made around the cervix.  The posterior cul-de-sac was entered sharply with curved Mayo scissors and a long weighted speculum was placed in the posterior cul-de-sac.  Bilateral uterosacral ligaments were grasped, cut, and suture ligated.  The anterior cul-de-sac was then entered sharply and a Deaver was placed anteriorly.  Bilateral cardinal ligaments and bladder pillars were grasped, cut, and suture ligated bilaterally.  Hemostasis was noted.  The uterus was then grasped and delivered through the posterior cul-de-sac and remaining pedicles were clamped bilaterally and the uterus was excised using curved Mayo scissors and passed off to be sent to pathology.  The uterus, bilateral fallopian tubes and ovaries were confirmed to be present in the pathology specimen.  The remaining pedicles were then suture ligated and noted to be hemostatic.  The posterior vaginal  cuff was then sutured in a running locked fashion. Hemostasis was achieved.  The remaining vaginal cuff was reapproximated using figure-of-eight sutures.  Once hemostasis was noted and the vaginal cuff was closed, it was evident that she still had a bulge in the anterior vagina from a cystocele.  The decision was made to proceed with an anterior repair as the patient's main complaint was uterine and vaginal prolapse.  1% lidocaine was used with epi and injected to the anterior vaginal wall.  The vagina was grasped with 2 Allis clamps and a midline  incision was made with a scalpel.  The cervical vesical fascia and peritoneum were dissected free of the vaginal wall and vaginal mucosa.  The cystocele was imbricated using figure-of-eight sutures. The excessive vaginal wall mucosa was excised and the vaginal wall was reapproximated.  Hemostasis was achieved.  All instruments were removed from the vagina and our attention was returned to the abdomen.  The abdomen was re-insufflated and the pelvis was inspected.  All pedicles were noted to be hemostatic.  Instruments and trocars were removed from the abdomen.  A deep stitch was placed in the infraumbilical incision and the skin of both abdominal incisions was closed with Vicryl.  Bandages were placed.  Sponge, lap, needle, instrument counts were correct x2.     Zelphia Cairo, MD     GA/MEDQ  D:  08/30/2017  T:  08/30/2017  Job:  [id number]

## 2017-08-30 NOTE — Discharge Summary (Signed)
Physician Discharge Summary  Patient ID: Nicolasa DuckingMary E Doubrava MRN: [id number] DOB/AGE: 55/02/1961 55 y.o.  Admit date: 08/29/2017 Discharge date: 08/30/2017  Admission Diagnoses:  Pelvic organ prolapse  Discharge Diagnoses:  Active Problems:   Prolapse of female pelvic organs   Discharged Condition: stable  Hospital Course: pt was admitted for post op care.  Pain was well controlled and vital signs stable.  On POD1, she was ready for discharge.  She was ambulating and voiding without problems.  Passing flatus and tolerating a regular diet.    Consults: None  Significant Diagnostic Studies: labs: cbc  Treatments: surgery: LAVH/BSO, Anterior repair  Discharge Exam: Blood pressure 114/62, pulse 68, temperature 98.8 F (37.1 C), temperature source Oral, resp. rate 16, height 5\' 8"  (1.727 m), weight 187 lb 4.8 oz (85 kg), SpO2 96 %. General appearance: alert and cooperative GI: normal findings: soft, non-tender Incision/Wound:c/d/i   Disposition:     Follow-up Information    Zelphia CairoAdkins, Koleen Celia, MD. Schedule an appointment as soon as possible for a visit in 2 week(s).   Specialty:  Obstetrics and Gynecology Contact information: [address], [address] [place] [address] [phone]           Signed: Zelphia CairoGretchen Miyu Fenderson 08/30/2017, 7:55 AM

## 2021-04-22 ENCOUNTER — Ambulatory Visit: Payer: 59 | Admitting: Physician Assistant

## 2021-04-23 ENCOUNTER — Ambulatory Visit: Payer: Self-pay

## 2021-04-23 ENCOUNTER — Ambulatory Visit (INDEPENDENT_AMBULATORY_CARE_PROVIDER_SITE_OTHER): Payer: 59 | Admitting: Physician Assistant

## 2021-04-23 ENCOUNTER — Encounter: Payer: Self-pay | Admitting: Physician Assistant

## 2021-04-23 DIAGNOSIS — M7061 Trochanteric bursitis, right hip: Secondary | ICD-10-CM | POA: Diagnosis not present

## 2021-04-23 DIAGNOSIS — M7062 Trochanteric bursitis, left hip: Secondary | ICD-10-CM

## 2021-04-23 DIAGNOSIS — M1612 Unilateral primary osteoarthritis, left hip: Secondary | ICD-10-CM

## 2021-04-23 DIAGNOSIS — M25559 Pain in unspecified hip: Secondary | ICD-10-CM | POA: Diagnosis not present

## 2021-04-23 DIAGNOSIS — M545 Low back pain, unspecified: Secondary | ICD-10-CM | POA: Diagnosis not present

## 2021-04-23 MED ORDER — LIDOCAINE HCL 1 % IJ SOLN
3.0000 mL | INTRAMUSCULAR | Status: AC | PRN
  Administered 2021-04-23: 3 mL

## 2021-04-23 MED ORDER — METHYLPREDNISOLONE ACETATE 40 MG/ML IJ SUSP
40.0000 mg | INTRAMUSCULAR | Status: AC | PRN
  Administered 2021-04-23: 40 mg via INTRA_ARTICULAR

## 2021-04-23 NOTE — Progress Notes (Signed)
Office Visit Note   Patient: Renee Conley           Date of Birth: 10/15/1960           MRN: [id number] Visit Date: 04/23/2021              Requested by: No referring provider defined for this encounter. PCP: Roseanna Rainbow, PA-C (Inactive)   Assessment & Plan: Visit Diagnoses:  1. Acute bilateral low back pain without sciatica   2. Hip pain   3. Trochanteric bursitis, left hip   4. Trochanteric bursitis, right hip     Plan: She shown IT band stretching exercises that she can perform on her own.  In regards to the hip bursitis she can have injections as often as every 3 months if needed.  Given the fact that she is having no radicular symptoms today with lumbar spine and no groin pain not recommend any further work-up of her back or hips.  Questions were encouraged and answered at length.  Follow-Up Instructions: Return if symptoms worsen or fail to improve.   Orders:  Orders Placed This Encounter  Procedures   Large Joint Inj   XR Lumbar Spine 2-3 Views   XR Pelvis 1-2 Views   No orders of the defined types were placed in this encounter.     Procedures: Large Joint Inj: bilateral greater trochanter on 04/23/2021 4:46 PM Indications: pain Details: 22 G 1.5 in needle, lateral approach  Arthrogram: No  Medications (Right): 3 mL lidocaine 1 %; 40 mg methylPREDNISolone acetate 40 MG/ML Medications (Left): 3 mL lidocaine 1 %; 40 mg methylPREDNISolone acetate 40 MG/ML Outcome: tolerated well, no immediate complications Procedure, treatment alternatives, risks and benefits explained, specific risks discussed. Consent was given by the patient. Immediately prior to procedure a time out was called to verify the correct patient, procedure, equipment, support staff and site/side marked as required. Patient was prepped and draped in the usual sterile fashion.      Clinical Data: No additional findings.   Subjective: Chief Complaint  Patient presents with   Lower  Back - Pain   Left Hip - Pain   Right Hip - Pain    HPI Renee Conley is a pleasant 60 year old female were seen for the first time for bilateral hip pain.  States started 22 years ago after her son was born.  She has pain lateral aspect of both hips.  She was seen at another orthopedic practice here in town but due to change in insurance she was no longer able to go there.  Reports that she has been getting trochanteric injections bilateral hips that are helped for over a year at the time.  She also reports that she was told that in [place] that she needed to have a hip replacement.  She is having no groin pain.  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  She denies any numbness tingling down either leg.  She takes Tylenol and sometimes ibuprofen due to the pain lateral aspect of both hips.  She has pain when lying on either hip.  Review of Systems  Constitutional:  Negative for chills and fever.    Objective: Vital Signs: There were no vitals taken for this visit.  Physical Exam General: Well-developed well-nourished female no acute distress.  Ambulates without any assistive device nonantalgic gait.  She is  able to get on and off the exam table on her own. Psych: Alert and oriented x3.  Ortho Exam Hips she has excellent range of motion of both hips without pain.  Tenderness over the trochanteric region of both hips.  Negative straight leg raise bilaterally.  5 out of 5 strength throughout lower extremities against resistance bilaterally. Specialty Comments:  No specialty comments available.  Imaging: XR Lumbar Spine 2-3 Views  Result Date: 04/23/2021 Lumbar spine 2 views: No acute fractures.  Mild degenerative changes throughout the lumbar spine.  Slight grade 1 spondylolisthesis at L5-S1.  Facet arthritic changes L2-3 and L3-4  XR Pelvis 1-2 Views  Result Date:  04/23/2021 AP pelvis: Bilateral hips well located.  No acute fractures.  Mild to moderate changes involving left hip with periarticular spurring.  Slight flattening of the left femoral head compared to the right.  Right hip overall well-maintained.    PMFS History: There are no problems to display for this patient.  Past Medical History:  Diagnosis Date   Arthritis    History of colon polyps 2014   Uterine prolapse    Wears contact lenses     History reviewed. No pertinent family history.  Past Surgical History:  Procedure Laterality Date   COLONOSCOPY  2014  approx.   LAPAROSCOPIC VAGINAL HYSTERECTOMY WITH SALPINGECTOMY Bilateral 08/29/2017   Procedure: LAPAROSCOPIC ASSISTED VAGINAL HYSTERECTOMY WITH SALPINGECTOMY, ANTERIOR REPAIR;  Surgeon: Zelphia Cairo, MD;  Location: [facility];  Service: Gynecology;  Laterality: Bilateral;  NEED BED   NO PAST SURGERIES     Social History   Occupational History   Not on file  Tobacco Use   Smoking status: Never   Smokeless tobacco: Never  Substance and Sexual Activity   Alcohol use: Yes    Comment: occasional   Drug use: No   Sexual activity: Not on file

## 2021-08-07 ENCOUNTER — Encounter (HOSPITAL_BASED_OUTPATIENT_CLINIC_OR_DEPARTMENT_OTHER): Payer: Self-pay | Admitting: Medical

## 2021-08-07 ENCOUNTER — Other Ambulatory Visit: Payer: Self-pay

## 2021-08-07 ENCOUNTER — Ambulatory Visit (INDEPENDENT_AMBULATORY_CARE_PROVIDER_SITE_OTHER): Payer: 59 | Admitting: Medical

## 2021-08-07 VITALS — BP 126/69 | HR 74 | Ht 66.0 in | Wt 190.4 lb

## 2021-08-07 DIAGNOSIS — Z01419 Encounter for gynecological examination (general) (routine) without abnormal findings: Secondary | ICD-10-CM

## 2021-08-07 DIAGNOSIS — Z1231 Encounter for screening mammogram for malignant neoplasm of breast: Secondary | ICD-10-CM | POA: Diagnosis not present

## 2021-08-07 NOTE — Progress Notes (Signed)
History:  Renee Conley is a 61 y.o. G2P2 who presents to clinic today for annual exam and to establish care. The patient was previously seen at [facility] who no longer accept her insurance. She had a hysterectomy 08/29/2017 due to issues with bladder prolapse per patient. This did not fully resolve her bladder issues, so she later had a bladder tack procedure and no longer has any concerns with bladder function. She had a normal mammogram 08/29/20 and denies history of abnormal mammograms. She expressed concerns with recent weight gain, left knee pain and occasional hot flashes. She is asking about Provitalize probiotic for menopause. She declines STD testing today.   The following portions of the patient's history were reviewed and updated as appropriate: allergies, current medications, family history, past medical history, social history, past surgical history and problem list.  Review of Systems:  Review of Systems  Constitutional:  Negative for fever and malaise/fatigue.  Gastrointestinal:  Negative for abdominal pain, constipation, diarrhea, nausea and vomiting.  Genitourinary:  Negative for dysuria, frequency and urgency.       Neg - vaginal bleeding, discharge     Objective:  Physical Exam BP 126/69    Pulse 74    Ht 5\' 6"  (1.676 m)    Wt 190 lb 6.4 oz (86.4 kg)    BMI 30.73 kg/m  Physical Exam Exam conducted with a chaperone present.  Constitutional:      General: She is not in acute distress.    Appearance: Normal appearance.  HENT:     Head: Normocephalic.  Cardiovascular:     Rate and Rhythm: Normal rate and regular rhythm.     Heart sounds: No murmur heard. Pulmonary:     Effort: Pulmonary effort is normal. No respiratory distress.     Breath sounds: Normal breath sounds. No wheezing.  Abdominal:     General: Abdomen is flat. Bowel sounds are normal. There is no distension.     Palpations: Abdomen is soft. There is no mass.     Tenderness: There  is no abdominal tenderness. There is no guarding.     Hernia: No hernia is present.  Genitourinary:    General: Normal vulva.     Vagina: No vaginal discharge or bleeding.     Comments: Cervix is surgically absent. Vaginal cuff intact.  Bimanual performed without tenderness Skin:    General: Skin is warm and dry.     Findings: No erythema.  Neurological:     Mental Status: She is alert and oriented to person, place, and time.  Psychiatric:        Mood and Affect: Mood normal.    Health Maintenance Due  Topic Date Due   Zoster Vaccines- Shingrix (1 of 2) Never done   COVID-19 Vaccine (3 - Booster for Pfizer series) 10/25/2019    Assessment & Plan:  Well-woman exam  - Patient informed she will no longer need pap smears in the future due to hysterectomy    Breast cancer screening by mammogram - MM Digital Screening; Future  3. Menopausal symptoms  - Patient would like to consider trying Provitalize Menopause Probiotic - Researched ingredients with patient today and explained the potential benefits of the product - Advised it would be worth a try as side effects are unlikely given the ingredients and if symptoms persist or worsen she can return to discuss other Rx options  Patient to return in 1 year for annual exam or sooner PRN  Approximately 20 minutes of total time was spent with this patient on chart review, updating health maintenance from patient history taking, research of product and patient education, physical exam and documentation.   Marny Lowenstein, PA-C 08/07/2021 8:52 AM

## 2021-08-31 ENCOUNTER — Ambulatory Visit (HOSPITAL_BASED_OUTPATIENT_CLINIC_OR_DEPARTMENT_OTHER): Payer: 59 | Admitting: Radiology

## 2021-09-01 ENCOUNTER — Other Ambulatory Visit: Payer: Self-pay

## 2021-09-01 ENCOUNTER — Ambulatory Visit (HOSPITAL_BASED_OUTPATIENT_CLINIC_OR_DEPARTMENT_OTHER)
Admission: RE | Admit: 2021-09-01 | Discharge: 2021-09-01 | Disposition: A | Discharge status: Home / Self Care | Payer: 59 | Source: Ambulatory Visit | Attending: Medical | Admitting: Medical

## 2021-09-01 ENCOUNTER — Encounter (HOSPITAL_BASED_OUTPATIENT_CLINIC_OR_DEPARTMENT_OTHER): Payer: Self-pay | Admitting: Radiology

## 2021-09-01 DIAGNOSIS — Z1231 Encounter for screening mammogram for malignant neoplasm of breast: Secondary | ICD-10-CM | POA: Diagnosis present

## 2022-08-13 ENCOUNTER — Ambulatory Visit (HOSPITAL_BASED_OUTPATIENT_CLINIC_OR_DEPARTMENT_OTHER): Payer: 59 | Admitting: Obstetrics & Gynecology

## 2024-08-24 ENCOUNTER — Other Ambulatory Visit (HOSPITAL_BASED_OUTPATIENT_CLINIC_OR_DEPARTMENT_OTHER): Payer: Self-pay | Admitting: Obstetrics and Gynecology

## 2024-08-24 DIAGNOSIS — Z1231 Encounter for screening mammogram for malignant neoplasm of breast: Secondary | ICD-10-CM

## 2024-09-24 ENCOUNTER — Ambulatory Visit (HOSPITAL_BASED_OUTPATIENT_CLINIC_OR_DEPARTMENT_OTHER): Payer: Self-pay | Admitting: Radiology

## 2024-10-19 ENCOUNTER — Encounter (HOSPITAL_BASED_OUTPATIENT_CLINIC_OR_DEPARTMENT_OTHER): Payer: Self-pay | Admitting: Obstetrics and Gynecology
